# Patient Record
Sex: Female | Born: 1995 | Race: White | Hispanic: No | Marital: Single | Smoking: Never smoker
Health system: Southern US, Community
[De-identification: ages and names within clinical notes are randomized; demographics above are authoritative.]

---

## 2015-10-11 ENCOUNTER — Encounter (HOSPITAL_COMMUNITY): Payer: Self-pay | Admitting: Emergency Medicine

## 2015-10-11 ENCOUNTER — Observation Stay (HOSPITAL_COMMUNITY)
Admission: EM | Admit: 2015-10-11 | Discharge: 2015-10-12 | Disposition: A | Discharge status: Other Acute Inpatient Hospital | Payer: BLUE CROSS/BLUE SHIELD | Attending: Physician Assistant | Admitting: Physician Assistant

## 2015-10-11 DIAGNOSIS — N921 Excessive and frequent menstruation with irregular cycle: Secondary | ICD-10-CM | POA: Diagnosis present

## 2015-10-11 DIAGNOSIS — D649 Anemia, unspecified: Secondary | ICD-10-CM | POA: Diagnosis present

## 2015-10-11 DIAGNOSIS — R55 Syncope and collapse: Secondary | ICD-10-CM

## 2015-10-11 DIAGNOSIS — N938 Other specified abnormal uterine and vaginal bleeding: Secondary | ICD-10-CM | POA: Diagnosis not present

## 2015-10-11 DIAGNOSIS — D62 Acute posthemorrhagic anemia: Secondary | ICD-10-CM | POA: Diagnosis present

## 2015-10-11 LAB — CBC WITH DIFFERENTIAL/PLATELET
BASOS ABS: 0 10*3/uL (ref 0.0–0.1)
BASOS PCT: 0 %
Basophils Absolute: 0 10*3/uL (ref 0.0–0.1)
Basophils Relative: 0 %
EOS ABS: 0 10*3/uL (ref 0.0–0.7)
EOS PCT: 1 %
EOS PCT: 0 %
Eosinophils Absolute: 0.1 10*3/uL (ref 0.0–0.7)
HEMATOCRIT: 24.1 % — AB (ref 36.0–46.0)
HEMATOCRIT: 20.5 % — AB (ref 36.0–46.0)
HEMOGLOBIN: 6.7 g/dL — AB (ref 12.0–15.0)
Hemoglobin: 8 g/dL — ABNORMAL LOW (ref 12.0–15.0)
LYMPHS ABS: 1.7 10*3/uL (ref 0.7–4.0)
LYMPHS PCT: 17 %
LYMPHS PCT: 26 %
Lymphs Abs: 2.8 10*3/uL (ref 0.7–4.0)
MCH: 29.2 pg (ref 26.0–34.0)
MCH: 29 pg (ref 26.0–34.0)
MCHC: 33.2 g/dL (ref 30.0–36.0)
MCHC: 32.7 g/dL (ref 30.0–36.0)
MCV: 88 fL (ref 78.0–100.0)
MCV: 88.7 fL (ref 78.0–100.0)
MONO ABS: 0.6 10*3/uL (ref 0.1–1.0)
MONOS PCT: 3 %
Monocytes Absolute: 0.3 10*3/uL (ref 0.1–1.0)
Monocytes Relative: 6 %
NEUTROS ABS: 7.4 10*3/uL (ref 1.7–7.7)
NEUTROS PCT: 71 %
Neutro Abs: 7.7 10*3/uL (ref 1.7–7.7)
Neutrophils Relative %: 76 %
PLATELETS: 300 10*3/uL (ref 150–400)
Platelets: 296 10*3/uL (ref 150–400)
RBC: 2.74 MIL/uL — AB (ref 3.87–5.11)
RBC: 2.31 MIL/uL — ABNORMAL LOW (ref 3.87–5.11)
RDW: 12.1 % (ref 11.5–15.5)
RDW: 12.2 % (ref 11.5–15.5)
WBC: 9.8 10*3/uL (ref 4.0–10.5)
WBC: 10.8 10*3/uL — ABNORMAL HIGH (ref 4.0–10.5)

## 2015-10-11 LAB — COMPREHENSIVE METABOLIC PANEL
ALT: 12 U/L — AB (ref 14–54)
AST: 18 U/L (ref 15–41)
Albumin: 3.4 g/dL — ABNORMAL LOW (ref 3.5–5.0)
Alkaline Phosphatase: 45 U/L (ref 38–126)
Anion gap: 8 (ref 5–15)
BILIRUBIN TOTAL: 0.4 mg/dL (ref 0.3–1.2)
BUN: 11 mg/dL (ref 6–20)
CALCIUM: 8 mg/dL — AB (ref 8.9–10.3)
CO2: 25 mmol/L (ref 22–32)
CREATININE: 0.86 mg/dL (ref 0.44–1.00)
Chloride: 105 mmol/L (ref 101–111)
Glucose, Bld: 93 mg/dL (ref 65–99)
Potassium: 4 mmol/L (ref 3.5–5.1)
Sodium: 138 mmol/L (ref 135–145)
TOTAL PROTEIN: 5.9 g/dL — AB (ref 6.5–8.1)

## 2015-10-11 LAB — URINE MICROSCOPIC-ADD ON

## 2015-10-11 LAB — URINALYSIS, ROUTINE W REFLEX MICROSCOPIC
BILIRUBIN URINE: NEGATIVE
Glucose, UA: NEGATIVE mg/dL
KETONES UR: NEGATIVE mg/dL
Leukocytes, UA: NEGATIVE
NITRITE: NEGATIVE
PROTEIN: NEGATIVE mg/dL
Specific Gravity, Urine: 1.016 (ref 1.005–1.030)
pH: 7 (ref 5.0–8.0)

## 2015-10-11 LAB — ABO/RH: ABO/RH(D): O POS

## 2015-10-11 LAB — PREPARE RBC (CROSSMATCH)

## 2015-10-11 LAB — POC URINE PREG, ED: PREG TEST UR: NEGATIVE

## 2015-10-11 LAB — CBG MONITORING, ED: GLUCOSE-CAPILLARY: 85 mg/dL (ref 65–99)

## 2015-10-11 MED ORDER — FUROSEMIDE 10 MG/ML IJ SOLN
20.0000 mg | Freq: Once | INTRAMUSCULAR | Status: AC
  Administered 2015-10-11: 20 mg via INTRAVENOUS
  Filled 2015-10-11: qty 2

## 2015-10-11 MED ORDER — MEGESTROL ACETATE 40 MG PO TABS
120.0000 mg | ORAL_TABLET | Freq: Every day | ORAL | Status: DC
  Administered 2015-10-11 – 2015-10-12 (×2): 120 mg via ORAL
  Filled 2015-10-11 (×3): qty 3

## 2015-10-11 MED ORDER — STERILE WATER FOR INJECTION IJ SOLN
INTRAMUSCULAR | Status: AC
Start: 2015-10-11 — End: 2015-10-11
  Administered 2015-10-11: 5 mL
  Filled 2015-10-11: qty 10

## 2015-10-11 MED ORDER — ONDANSETRON HCL 4 MG/2ML IJ SOLN
4.0000 mg | Freq: Once | INTRAMUSCULAR | Status: AC
  Administered 2015-10-11: 4 mg via INTRAVENOUS
  Filled 2015-10-11: qty 2

## 2015-10-11 MED ORDER — SODIUM CHLORIDE 0.9 % IV SOLN
8.0000 mg | Freq: Three times a day (TID) | INTRAVENOUS | Status: DC | PRN
  Administered 2015-10-11 – 2015-10-12 (×2): 8 mg via INTRAVENOUS
  Filled 2015-10-11 (×4): qty 4

## 2015-10-11 MED ORDER — ONDANSETRON 4 MG PO TBDP: 4.0000 mg | ORAL_TABLET | Freq: Three times a day (TID) | ORAL | Status: DC | PRN

## 2015-10-11 MED ORDER — MEDROXYPROGESTERONE ACETATE 10 MG PO TABS
20.0000 mg | ORAL_TABLET | Freq: Every day | ORAL | Status: DC
  Administered 2015-10-11: 20 mg via ORAL
  Filled 2015-10-11 (×2): qty 2

## 2015-10-11 MED ORDER — SODIUM CHLORIDE 0.9 % IV SOLN: 10.0000 mL/h | Freq: Once | INTRAVENOUS | Status: DC

## 2015-10-11 MED ORDER — ESTROGENS CONJUGATED 25 MG IJ SOLR
25.0000 mg | Freq: Four times a day (QID) | INTRAMUSCULAR | Status: DC
Start: 2015-10-11 — End: 2015-10-12
  Administered 2015-10-11 – 2015-10-12 (×4): 25 mg via INTRAVENOUS
  Filled 2015-10-11 (×8): qty 25

## 2015-10-11 MED ORDER — SODIUM CHLORIDE 0.9 % IV BOLUS (SEPSIS)
1000.0000 mL | Freq: Once | INTRAVENOUS | Status: AC
Start: 2015-10-11 — End: 2015-10-11
  Administered 2015-10-11: 1000 mL via INTRAVENOUS

## 2015-10-11 MED ORDER — ESTROGENS CONJUGATED 25 MG IJ SOLR
25.0000 mg | Freq: Once | INTRAMUSCULAR | Status: AC
  Administered 2015-10-11: 25 mg via INTRAVENOUS
  Filled 2015-10-11: qty 25

## 2015-10-11 MED ORDER — ACETAMINOPHEN 325 MG PO TABS
650.0000 mg | ORAL_TABLET | Freq: Once | ORAL | Status: AC
  Administered 2015-10-11: 650 mg via ORAL
  Filled 2015-10-11: qty 2

## 2015-10-11 MED ORDER — MEDROXYPROGESTERONE ACETATE 10 MG PO TABS: 20.0000 mg | ORAL_TABLET | Freq: Every day | ORAL | Status: DC

## 2015-10-11 MED ORDER — DIPHENHYDRAMINE HCL 25 MG PO CAPS
25.0000 mg | ORAL_CAPSULE | Freq: Once | ORAL | Status: AC
  Administered 2015-10-11: 25 mg via ORAL
  Filled 2015-10-11: qty 1

## 2015-10-11 MED ORDER — SODIUM CHLORIDE 0.9 % IV SOLN
Freq: Once | INTRAVENOUS | Status: AC
  Administered 2015-10-11: 22:00:00 via INTRAVENOUS

## 2015-10-11 MED ORDER — IBUPROFEN 800 MG PO TABS: 800.0000 mg | ORAL_TABLET | Freq: Three times a day (TID) | ORAL | Status: DC | PRN

## 2015-10-11 MED ORDER — SODIUM CHLORIDE 0.9 % IV SOLN
Freq: Once | INTRAVENOUS | Status: AC
  Administered 2015-10-11: 23:00:00 via INTRAVENOUS

## 2015-10-11 MED ORDER — FUROSEMIDE 10 MG/ML IJ SOLN
20.0000 mg | Freq: Once | INTRAMUSCULAR | Status: AC
  Administered 2015-10-12: 20 mg via INTRAVENOUS
  Filled 2015-10-11: qty 2

## 2015-10-11 MED ORDER — SODIUM CHLORIDE 0.9 % IV BOLUS (SEPSIS)
2000.0000 mL | Freq: Once | INTRAVENOUS | Status: AC
  Administered 2015-10-11: 2000 mL via INTRAVENOUS

## 2015-10-11 MED ORDER — KETOROLAC TROMETHAMINE 30 MG/ML IJ SOLN
30.0000 mg | Freq: Once | INTRAMUSCULAR | Status: AC
  Administered 2015-10-11: 30 mg via INTRAVENOUS
  Filled 2015-10-11: qty 1

## 2015-10-11 NOTE — ED Notes (Signed)
Pt given applesauce and ginger ale 

## 2015-10-11 NOTE — ED Provider Notes (Signed)
CSN: [id number]     Arrival date & time 10/11/15  1134 History   First MD Initiated Contact with Patient 10/11/15 1204     Chief Complaint  Patient presents with  . Loss of Consciousness  . Vaginal Bleeding     (Consider location/radiation/quality/duration/timing/severity/associated sxs/prior Treatment) HPI   Crystal Rocha is a 19 y.o. female, patient with no pertinent past medical history, presenting to the ED with an episode of syncope this afternoon. Pt was lowered to the ground by her boyfriend. Boyfriend states pt was coming out of the bathroom when she told him she was going to pass out. Pt regained consciousness once she was on the ground. Patient has had vaginal bleeding for the last two weeks. The first week was normal for her period with normal timing and normal flow. The second week pt states she has been going through a pad and a tampon every half hour. Pt denies fever/chills, N/V, abdominal pain, urinary complaints, head trauma, seizures, or any other complaints. Pt does not have a regular PCP or OBGYN. Pt is not on OCP or other hormonal contraceptives. Pt states she knows she has been anemic before with a Hb of 8 or 9 (found during an attempted blood donation).     History reviewed. No pertinent past medical history. History reviewed. No pertinent past surgical history. History reviewed. No pertinent family history. Social History  Substance Use Topics  . Smoking status: Never Smoker   . Smokeless tobacco: None  . Alcohol Use: Yes     Comment: Rarely   OB History    Gravida Para Term Preterm AB TAB SAB Ectopic Multiple Living       Review of Systems  Respiratory: Negative for shortness of breath.   Cardiovascular: Negative for chest pain.  Gastrointestinal: Negative for vomiting, abdominal pain and diarrhea.  Genitourinary: Positive for vaginal bleeding. Negative for dysuria.  Skin: Negative for color change and pallor.  Neurological: Positive  for syncope. Negative for seizures, weakness and numbness.  All other systems reviewed and are negative.     Allergies  Review of patient's allergies indicates no known allergies.  Home Medications   Prior to Admission medications   Medication Sig Start Date End Date Taking? Authorizing Provider  dextromethorphan-guaiFENesin (MUCINEX DM) 30-600 MG 12hr tablet Take 1 tablet by mouth 2 (two) times daily as needed for cough (headcold).   Yes Historical Provider, MD  DM-Phenylephrine-Acetaminophen (TYLENOL COLD MAX PO) Take 1 tablet by mouth every 4 (four) hours as needed (cold symptoms).   Yes Historical Provider, MD  sodium-potassium bicarbonate (ALKA-SELTZER GOLD) TBEF dissolvable tablet Take 1 tablet by mouth once.   Yes Historical Provider, MD  medroxyPROGESTERone (PROVERA) 10 MG tablet Take 2 tablets (20 mg total) by mouth daily. 10/11/15   Amare Bail C Aaron Boeh, PA-C  ondansetron (ZOFRAN ODT) 4 MG disintegrating tablet Take 1 tablet (4 mg total) by mouth every 8 (eight) hours as needed for nausea or vomiting. 10/11/15   Burgundy Matuszak C Desiree Daise, PA-C   BP 112/69 mmHg  Pulse 116  Resp 18  SpO2 97%  LMP 09/27/2015 (Exact Date) Physical Exam  Constitutional: She appears well-developed and well-nourished. No distress.  HENT:  Head: Normocephalic and atraumatic.  Eyes: Conjunctivae are normal. Pupils are equal, round, and reactive to light.  Cardiovascular: Normal rate, regular rhythm and normal heart sounds.   Pulmonary/Chest: Effort normal and breath sounds normal. No respiratory distress.  Abdominal: Soft. Normal  appearance and bowel sounds are normal. There is no tenderness. There is no CVA tenderness.  Genitourinary: Pelvic exam was performed with patient supine.  External genitalia normal Vagina with copious bleeding and a large clot about the size of tennis ball Cervix  Os closed negative for cervical motion tenderness Adnexa palpated, no masses or negative for tenderness noted Bladder palpated  negative for tenderness Uterus palpated no masses or negative for tenderness Otherwise normal female genitalia. RN Fleet Contras served as chaperone during exam. Dr. Clarene Duke was called in and also evaluated the patient during the pelvic exam.   Musculoskeletal: She exhibits no edema or tenderness.  Neurological: She is alert.  Skin: Skin is warm and dry. She is not diaphoretic.  Nursing note and vitals reviewed.   ED Course  Procedures (including critical care time) Labs Review Labs Reviewed  CBC WITH DIFFERENTIAL/PLATELET - Abnormal; Notable for the following:    RBC 2.74 (*)    Hemoglobin 8.0 (*)    HCT 24.1 (*)    All other components within normal limits  COMPREHENSIVE METABOLIC PANEL - Abnormal; Notable for the following:    Calcium 8.0 (*)    Total Protein 5.9 (*)    Albumin 3.4 (*)    ALT 12 (*)    All other components within normal limits  URINALYSIS, ROUTINE W REFLEX MICROSCOPIC (NOT AT [facility]) - Abnormal; Notable for the following:    APPearance CLOUDY (*)    Hgb urine dipstick LARGE (*)    All other components within normal limits  URINE MICROSCOPIC-ADD ON - Abnormal; Notable for the following:    Squamous Epithelial / LPF 0-5 (*)    Bacteria, UA FEW (*)    All other components within normal limits  CBC WITH DIFFERENTIAL/PLATELET - Abnormal; Notable for the following:    WBC 10.8 (*)    RBC 2.31 (*)    Hemoglobin 6.7 (*)    HCT 20.5 (*)    All other components within normal limits  CBG MONITORING, ED  POC URINE PREG, ED  PREPARE RBC (CROSSMATCH)  TYPE AND SCREEN    Imaging Review No results found. I have personally reviewed and evaluated these images and lab results as part of my medical decision-making.   EKG Interpretation None      CRITICAL CARE Performed by: Rutherford Alarie C Brooks Stotz Total critical care time: 60 minutes Critical care time was exclusive of separately billable procedures and treating other patients. Critical care was necessary to treat or prevent  imminent or life-threatening deterioration. Critical care was time spent personally by me on the following activities: development of treatment plan with patient and/or surrogate as well as nursing, discussions with consultants, evaluation of patient's response to treatment, examination of patient, obtaining history from patient or surrogate, ordering and performing treatments and interventions, ordering and review of laboratory studies, ordering and review of radiographic studies, pulse oximetry and re-evaluation of patient's condition.   MDM   Final diagnoses:  Dysfunctional uterine bleeding  Syncope, unspecified syncope type  Anemia, unspecified anemia type    Kimanh Ashbaugh presents with heavy vaginal bleeding for the last week and a syncopal episode today.  Findings and plan of care discussed with Laurence Spates, MD.  Patient is significantly anemic, her exam reveals significant current uterine bleeding, but her abdominal exam is benign and she is negative for CMT. OB/GYN consult called. 1:48 PM Spoke with Dr. Debroah Loop, OBGYN on call, institute hormonal therapy and iron. Check Hb in 4 hours if patient not  feeling better. 25mg  Premarin IV. Bleeding should be slowed in an hour or two. Provera 20mg /day for 30 days. If the hemoglobin continues to drop, [facility]. 3:48 PM patient states that she is still bleeding at the same rate. Patient also complains of some continued dizziness. Patient continues to deny pain or discomfort. Patient is tolerating food and oral fluid intake. 4:40 PM Repeat Hb found to be 6.1 (value called to RN and then communicated with me). [facility]. Agreed to accept the patient for direct admit to the third floor. Requested that we start the blood transfusion here in the ED. patient was updated on this new plan of care and the reasons were explained to her. The risks and benefits of blood transfusion were  communicated with the patient. Patient voiced understanding of this information and agreed to the blood transfusion. [facility] also [facility]. 5:27 PM End of shift patient care handoff report given to [facility]cl[facility] FlintJamie Ward, PA-C and asked her to check in on the patient every about 20-30 minutes.   Filed Vitals:   10/11/15 1156 10/11/15 1433 10/11/15 1644  BP: 111/82 130/73 112/69  Pulse: 92 98 116  Resp: 22 16 18   SpO2: 98% 98% 97%     Anselm PancoastShawn C Zacharia Sowles, PA-C 10/11/15 1729  Laurence Spatesachel Morgan Little, MD 10/12/15 [phone]

## 2015-10-11 NOTE — ED Notes (Signed)
RN assisted Shawn, PA with pelvic exam.  Pt has heavy bleeding, unable to get samples.

## 2015-10-11 NOTE — ED Notes (Signed)
Bed: WA21 Expected date:  Expected time:  Means of arrival:  Comments: EMS- 20s F, abnormal vaginal bleeding

## 2015-10-11 NOTE — ED Provider Notes (Signed)
Care assumed from previous provider PA Joy, case discussed, plan agreed upon.  [facility] is awaiting [facility]. Will receive first 15 minutes of transfusion here before transfer in order to delay treatment. Patient seen by me and at this time, patient is in good and stable condition.   Plan: continue to monitor for transfusion reaction until transfer to [facility].   6:57 PM - Patient transferred via carelink.   Chase PicketJaime Pilcher Joseandres Mazer, PA-C 10/11/15 1857  Courteney Randall AnLyn Mackuen, MD 10/12/15 [phone]

## 2015-10-11 NOTE — ED Notes (Signed)
Pt states that she had menstrual perios 2 weeks ago that was normal.  Yesterday she began to have cramping and spotting.   She has gone through 12 pads in a 24 hour period with no pain.  Pt had BM today, stood up and had syncopal episode.  No trauma from fall.  Pt reports continued significant bleeding with dizziness today.  BP 111/68 p 84 CBG 111  98% RA

## 2015-10-11 NOTE — Progress Notes (Signed)
Patient noted to have [employer] insurance without a pcp.  EDCM spoke to patient at bedside.  [place].

## 2015-10-12 ENCOUNTER — Observation Stay (HOSPITAL_COMMUNITY): Payer: BLUE CROSS/BLUE SHIELD

## 2015-10-12 DIAGNOSIS — D649 Anemia, unspecified: Secondary | ICD-10-CM | POA: Diagnosis present

## 2015-10-12 DIAGNOSIS — R55 Syncope and collapse: Secondary | ICD-10-CM | POA: Diagnosis not present

## 2015-10-12 DIAGNOSIS — N938 Other specified abnormal uterine and vaginal bleeding: Secondary | ICD-10-CM

## 2015-10-12 DIAGNOSIS — D62 Acute posthemorrhagic anemia: Secondary | ICD-10-CM

## 2015-10-12 LAB — CBC
HCT: 29.9 % — ABNORMAL LOW (ref 36.0–46.0)
HEMOGLOBIN: 10.5 g/dL — AB (ref 12.0–15.0)
MCH: 30.9 pg (ref 26.0–34.0)
MCHC: 35.1 g/dL (ref 30.0–36.0)
MCV: 87.9 fL (ref 78.0–100.0)
PLATELETS: 222 10*3/uL (ref 150–400)
RBC: 3.4 MIL/uL — ABNORMAL LOW (ref 3.87–5.11)
RDW: 12.8 % (ref 11.5–15.5)
WBC: 12.9 10*3/uL — ABNORMAL HIGH (ref 4.0–10.5)

## 2015-10-12 MED ORDER — IBUPROFEN 800 MG PO TABS: 800.0000 mg | ORAL_TABLET | Freq: Three times a day (TID) | ORAL | Status: AC | PRN

## 2015-10-12 MED ORDER — MEGESTROL ACETATE 40 MG PO TABS: 80.0000 mg | ORAL_TABLET | Freq: Every day | ORAL | Status: AC

## 2015-10-12 MED ORDER — FERROUS SULFATE 325 (65 FE) MG PO TABS: 325.0000 mg | ORAL_TABLET | Freq: Two times a day (BID) | ORAL | Status: AC

## 2015-10-12 MED ORDER — IBUPROFEN 800 MG PO TABS: 800.0000 mg | ORAL_TABLET | Freq: Three times a day (TID) | ORAL | Status: DC | PRN

## 2015-10-12 MED ORDER — MEGESTROL ACETATE 40 MG PO TABS: 80.0000 mg | ORAL_TABLET | Freq: Every day | ORAL | Status: DC

## 2015-10-12 MED ORDER — FERROUS SULFATE 325 (65 FE) MG PO TABS: 325.0000 mg | ORAL_TABLET | Freq: Two times a day (BID) | ORAL | Status: DC

## 2015-10-12 NOTE — Discharge Summary (Signed)
Physician Discharge Summary  Patient ID: Crystal Rocha MRN: [id number] DOB/AGE: 1996/06/22 19 y.o.  Admit date: 10/11/2015 Discharge date: 10/12/2015  Admission Diagnoses:DUB, anemia due to acute blood loss  Discharge Diagnoses: same Active Problems:   Menometrorrhagia   Acute blood loss anemia   Anemia   DUB (dysfunctional uterine bleeding)   Discharged Condition: good  Hospital Course:   Expand All Collapse All   HPI   Crystal Rocha is a 19 y.o. female, patient with no pertinent past medical history, presenting to the ED with an episode of syncope this afternoon. Pt was lowered to the ground by her boyfriend. Boyfriend states pt was coming out of the bathroom when she told him she was going to pass out. Pt regained consciousness once she was on the ground. Patient has had vaginal bleeding for the last two weeks. The first week was normal for her period with normal timing and normal flow. The second week pt states she has been going through a pad and a tampon every half hour. Pt denies fever/chills, N/V, abdominal pain, urinary complaints, head trauma, seizures, or any other complaints. Pt does not have a regular PCP or OBGYN. Pt is not on OCP or other hormonal contraceptives. Pt states she knows she has been anemic before with a Hb of 8 or 9 (found during an attempted blood donation).     History reviewed. No pertinent past medical history. History reviewed. No pertinent past surgical history. History reviewed. No pertinent family history. Social History  Substance Use Topics  . Smoking status: Never Smoker   . Smokeless tobacco: None  . Alcohol Use: Yes     Comment: Rarely   OB History    Gravida Para Term Preterm AB TAB SAB Ectopic Multiple Living       Review of Systems  Respiratory: Negative for shortness of breath.  Cardiovascular: Negative for chest pain.   Gastrointestinal: Negative for vomiting, abdominal pain and diarrhea.  Genitourinary: Positive for vaginal bleeding. Negative for dysuria.  Skin: Negative for color change and pallor.  Neurological: Positive for syncope. Negative for seizures, weakness and numbness.  All other systems reviewed and are negative.     Allergies  Review of patient's allergies indicates no known allergies.  Home Medications   Prior to Admission medications   Medication Sig Start Date End Date Taking? Authorizing Provider  dextromethorphan-guaiFENesin (MUCINEX DM) 30-600 MG 12hr tablet Take 1 tablet by mouth 2 (two) times daily as needed for cough (headcold).   Yes Historical Provider, MD  DM-Phenylephrine-Acetaminophen (TYLENOL COLD MAX PO) Take 1 tablet by mouth every 4 (four) hours as needed (cold symptoms).   Yes Historical Provider, MD  sodium-potassium bicarbonate (ALKA-SELTZER GOLD) TBEF dissolvable tablet Take 1 tablet by mouth once.   Yes Historical Provider, MD  medroxyPROGESTERone (PROVERA) 10 MG tablet Take 2 tablets (20 mg total) by mouth daily. 10/11/15   Shawn C Joy, PA-C  ondansetron (ZOFRAN ODT) 4 MG disintegrating tablet Take 1 tablet (4 mg total) by mouth every 8 (eight) hours as needed for nausea or vomiting. 10/11/15   Shawn C Joy, PA-C   BP 112/69 mmHg  Pulse 116  Resp 18  SpO2 97%  LMP 09/27/2015 (Exact Date) Physical Exam  Constitutional: She appears well-developed and well-nourished. No distress.  HENT:  Head: Normocephalic and atraumatic.  Eyes: Conjunctivae are normal. Pupils are equal, round, and reactive to light.  Cardiovascular: Normal rate, regular rhythm and normal  heart sounds.  Pulmonary/Chest: Effort normal and breath sounds normal. No respiratory distress.  Abdominal: Soft. Normal appearance and bowel sounds are normal. There is no tenderness. There is no CVA tenderness.   Genitourinary: Pelvic exam was performed with patient supine.  External genitalia normal Vagina with copious bleeding and a large clot about the size of tennis ball Cervix Os closed negative for cervical motion tenderness Adnexa palpated, no masses or negative for tenderness noted Bladder palpated negative for tenderness Uterus palpated no masses or negative for tenderness Otherwise normal female genitalia. RN Fleet Contras served as chaperone during exam. Dr. Clarene Duke was called in and also evaluated the patient during the pelvic exam.  Musculoskeletal: She exhibits no edema or tenderness.  Neurological: She is alert.  Skin: Skin is warm and dry. She is not diaphoretic.  Nursing note and vitals reviewed.   ED Course  Procedures (including critical care time) Labs Review Labs Reviewed  CBC WITH DIFFERENTIAL/PLATELET - Abnormal; Notable for the following:    RBC 2.74 (*)    Hemoglobin 8.0 (*)    HCT 24.1 (*)    All other components within normal limits  COMPREHENSIVE METABOLIC PANEL - Abnormal; Notable for the following:    Calcium 8.0 (*)    Total Protein 5.9 (*)    Albumin 3.4 (*)    ALT 12 (*)    All other components within normal limits  URINALYSIS, ROUTINE W REFLEX MICROSCOPIC (NOT AT [facility]) - Abnormal; Notable for the following:    APPearance CLOUDY (*)    Hgb urine dipstick LARGE (*)    All other components within normal limits  URINE MICROSCOPIC-ADD ON - Abnormal; Notable for the following:    Squamous Epithelial / LPF 0-5 (*)    Bacteria, UA FEW (*)    All other components within normal limits  CBC WITH DIFFERENTIAL/PLATELET - Abnormal; Notable for the following:    WBC 10.8 (*)    RBC 2.31 (*)    Hemoglobin 6.7 (*)    HCT 20.5 (*)    All other components within normal limits  CBG MONITORING, ED  POC URINE  PREG, ED  PREPARE RBC (CROSSMATCH)  TYPE AND SCREEN    Imaging Review  Imaging Results (Last 48 hours)    No results found.   I have personally reviewed and evaluated these images and lab results as part of my medical decision-making.  EKG Interpretation None    CRITICAL CARE Performed by: Shawn C Joy Total critical care time: 60 minutes Critical care time was exclusive of separately billable procedures and treating other patients. Critical care was necessary to treat or prevent imminent or life-threatening deterioration. Critical care was time spent personally by me on the following activities: development of treatment plan with patient and/or surrogate as well as nursing, discussions with consultants, evaluation of patient's response to treatment, examination of patient, obtaining history from patient or surrogate, ordering and performing treatments and interventions, ordering and review of laboratory studies, ordering and review of radiographic studies, pulse oximetry and re-evaluation of patient's condition.   MDM   Final diagnoses:  Dysfunctional uterine bleeding  Syncope, unspecified syncope type  Anemia, unspecified anemia type    Crystal Rocha presents with heavy vaginal bleeding for the last week and a syncopal episode today.  Findings and plan of care discussed with Laurence Spates, MD.  Patient is significantly anemic, her exam reveals significant current uterine bleeding, but her abdominal exam is benign and she is negative for CMT. OB/GYN consult  called. 1:48 PM Spoke with Dr. Debroah Loop, OBGYN on call, institute hormonal therapy and iron. Check Hb in 4 hours if patient not feeling better. 25mg  Premarin IV. Bleeding should be slowed in an hour or two. Provera 20mg /day for 30 days. If the hemoglobin continues to drop, transfer patient to [facility]. 3:48 PM patient states that she is still bleeding at the same rate. Patient also  complains of some continued dizziness. Patient continues to deny pain or discomfort. Patient is tolerating food and oral fluid intake. 4:40 PM Repeat Hb found to be 6.1 (value called to RN and then communicated with me). Spoke to Dr. Debroah Loop at [facility]. Agreed to accept the patient for direct admit to the third floor. Requested that we start the blood transfusion here in the ED. patient was updated on this new plan of care and the reasons were explained to her. The risks and benefits of blood transfusion were communicated with the patient. Patient voiced understanding of this information and agreed to the blood transfusion. Patient also agrees to the transfer to [facility]. 5:27 PM End of shift patient care handoff report given to [facility], PA-C and asked her to check in on the patient every about 20-30 minutes.   Filed Vitals:   10/11/15 1156 10/11/15 1433 10/11/15 1644  BP: 111/82 130/73 112/69  Pulse: 92 98 116  Resp: 22 16 18   SpO2: 98% 98% 97%     Anselm Pancoast, PA-C 10/11/15 1729  Laurence Spates, MD 10/12/15 [phone]       DUB Anemia requiring transfusion  Transfuse 3 units IV premarin Oral megestrol  Crystal Arms, MD      Patient received transfusion and hemoglobin was >10. She felt much better and bleeding improved but was still present after several doses of Premarin and Megace.  Consults: None  Significant Diagnostic Studies:CLINICAL DATA: Patient with heavy vaginal bleeding. Anemia.  EXAM: TRANSABDOMINAL AND TRANSVAGINAL ULTRASOUND OF PELVIS  TECHNIQUE: Both transabdominal and transvaginal ultrasound examinations of the pelvis were performed. Transabdominal technique was performed for global imaging of the pelvis including uterus, ovaries, adnexal regions, and pelvic cul-de-sac. It was necessary to proceed with endovaginal exam following the transabdominal exam to visualize  the endometrium.  COMPARISON: None  FINDINGS: Uterus  Measurements: 8.7 x 4.4 x 5.9 cm. No fibroids or other mass visualized.  Endometrium  The endometrial cavity particularly within the mid body and fundus is distended with complicated fluid. Within the lower uterine segment there is thickening of the endometrium with hyperechoic material measuring up to 1.5 cm.  Right ovary  Measurements: 2.6 x 1.3 x 1.5 cm. Normal appearance/no adnexal mass.  Left ovary  Measurements: 3.1 x 2.4 x 2.4 cm. Normal appearance/no adnexal mass.  Other findings  Small amount of mildly complicated fluid in the pelvis.  IMPRESSION: The endometrial cavity is distended with complicated fluid most compatible with blood products. Additionally within the endometrium at the level the lower uterine segment there is distention of the endometrium with predominantly echogenic material which may represent an endometrial mass, endometrial thickening or clotted blood products. This area measures up to 1.5 x 1.2 cm.  Consider further evaluation with sonohysterogram for confirmation prior to hysteroscopy. Endometrial sampling should also be considered if patient is at high risk for endometrial carcinoma. (Ref: Radiological Reasoning: Algorithmic Workup of Abnormal Vaginal Bleeding with Endovaginal Sonography and Sonohysterography. AJR 2008; 952:W41-32)   Electronically Signed  By: Annia Belt M.D.  On: 10/12/2015 10:58  Treatments: IV hydration  and transfusion 3 units PRBC  Discharge Exam: Blood pressure 128/61, pulse 94, temperature 98.6 F (37 C), temperature source Oral, resp. rate 17, height 5' (1.524 m), weight 125 lb (56.7 kg), last menstrual period 09/27/2015, SpO2 98 %. Alert cooperative not pale no distress  Disposition: Discharge home     Medication List    TAKE these medications        dextromethorphan-guaiFENesin 30-600 MG 12hr tablet  Commonly known as:   MUCINEX DM  Take 1 tablet by mouth 2 (two) times daily as needed for cough (headcold).     ferrous sulfate 325 (65 FE) MG tablet  Take 1 tablet (325 mg total) by mouth 2 (two) times daily with a meal.     ibuprofen 800 MG tablet  Commonly known as:  ADVIL,MOTRIN  Take 1 tablet (800 mg total) by mouth 3 (three) times daily as needed for cramping.     megestrol 40 MG tablet  Commonly known as:  MEGACE  Take 2 tablets (80 mg total) by mouth daily.     ondansetron 4 MG disintegrating tablet  Commonly known as:  ZOFRAN ODT  Take 1 tablet (4 mg total) by mouth every 8 (eight) hours as needed for nausea or vomiting.     sodium-potassium bicarbonate Tbef dissolvable tablet  Commonly known as:  ALKA-SELTZER GOLD  Take 1 tablet by mouth once.     TYLENOL COLD MAX PO  Take 1 tablet by mouth every 4 (four) hours as needed (cold symptoms).       F/u with her local Gyn in [place]  Signed: Tashiya Rocha  10/12/2015, 1:28 PM

## 2015-10-12 NOTE — Progress Notes (Addendum)
Anemia secondary to DUB requiring transfusion  Subjective: Patient reports continued bleeding, it is a little better, cramping continues.    Objective: I have reviewed patient's vital signs, intake and output, medications and labs.  General: alert, cooperative and no distress GI: soft, non-tender; bowel sounds normal; no masses,  no organomegaly     Assessment/Plan: DUB Anemia secondary to DUB requiring transfusion, has received 3 units  Continue Iv premarin and megestrol Sonogram today  CBC pending  LOS: 1 day    Crystal Rocha H 10/12/2015, 7:49 AM

## 2015-10-12 NOTE — Progress Notes (Signed)
Pt discharged to home with significant other.  Condition stable.  Pt ambulated to car with SO.  No equipment for home ordered at discharge.

## 2015-10-12 NOTE — Discharge Instructions (Signed)

## 2015-10-12 NOTE — H&P (Signed)
HPI   Crystal Rocha is a 19 y.o. female, patient with no pertinent past medical history, presenting to the ED with an episode of syncope this afternoon. Pt was lowered to the ground by her boyfriend. Boyfriend states pt was coming out of the bathroom when she told him she was going to pass out. Pt regained consciousness once she was on the ground. Patient has had vaginal bleeding for the last two weeks. The first week was normal for her period with normal timing and normal flow. The second week pt states she has been going through a pad and a tampon every half hour. Pt denies fever/chills, N/V, abdominal pain, urinary complaints, head trauma, seizures, or any other complaints. Pt does not have a regular PCP or OBGYN. Pt is not on OCP or other hormonal contraceptives. Pt states she knows she has been anemic before with a Hb of 8 or 9 (found during an attempted blood donation).     History reviewed. No pertinent past medical history. History reviewed. No pertinent past surgical history. History reviewed. No pertinent family history. Social History  Substance Use Topics  . Smoking status: Never Smoker   . Smokeless tobacco: None  . Alcohol Use: Yes     Comment: Rarely   OB History    Gravida Para Term Preterm AB TAB SAB Ectopic Multiple Living       Review of Systems  Respiratory: Negative for shortness of breath.  Cardiovascular: Negative for chest pain.  Gastrointestinal: Negative for vomiting, abdominal pain and diarrhea.  Genitourinary: Positive for vaginal bleeding. Negative for dysuria.  Skin: Negative for color change and pallor.  Neurological: Positive for syncope. Negative for seizures, weakness and numbness.  All other systems reviewed and are negative.     Allergies  Review of patient's allergies indicates no known allergies.  Home Medications   Prior to Admission medications   Medication Sig Start  Date End Date Taking? Authorizing Provider  dextromethorphan-guaiFENesin (MUCINEX DM) 30-600 MG 12hr tablet Take 1 tablet by mouth 2 (two) times daily as needed for cough (headcold).   Yes Historical Provider, MD  DM-Phenylephrine-Acetaminophen (TYLENOL COLD MAX PO) Take 1 tablet by mouth every 4 (four) hours as needed (cold symptoms).   Yes Historical Provider, MD  sodium-potassium bicarbonate (ALKA-SELTZER GOLD) TBEF dissolvable tablet Take 1 tablet by mouth once.   Yes Historical Provider, MD  medroxyPROGESTERone (PROVERA) 10 MG tablet Take 2 tablets (20 mg total) by mouth daily. 10/11/15   Shawn C Joy, PA-C  ondansetron (ZOFRAN ODT) 4 MG disintegrating tablet Take 1 tablet (4 mg total) by mouth every 8 (eight) hours as needed for nausea or vomiting. 10/11/15   Shawn C Joy, PA-C   BP 112/69 mmHg  Pulse 116  Resp 18  SpO2 97%  LMP 09/27/2015 (Exact Date) Physical Exam  Constitutional: She appears well-developed and well-nourished. No distress.  HENT:  Head: Normocephalic and atraumatic.  Eyes: Conjunctivae are normal. Pupils are equal, round, and reactive to light.  Cardiovascular: Normal rate, regular rhythm and normal heart sounds.  Pulmonary/Chest: Effort normal and breath sounds normal. No respiratory distress.  Abdominal: Soft. Normal appearance and bowel sounds are normal. There is no tenderness. There is no CVA tenderness.  Genitourinary: Pelvic exam was performed with patient supine.  External genitalia normal Vagina with copious bleeding and a large clot about the size of tennis ball Cervix Os closed negative for cervical motion tenderness Adnexa palpated, no  masses or negative for tenderness noted Bladder palpated negative for tenderness Uterus palpated no masses or negative for tenderness Otherwise normal female genitalia. RN Fleet Contras served as chaperone during exam. Dr. Clarene Duke was called in and also evaluated the patient during the pelvic  exam.  Musculoskeletal: She exhibits no edema or tenderness.  Neurological: She is alert.  Skin: Skin is warm and dry. She is not diaphoretic.  Nursing note and vitals reviewed.   ED Course  Procedures (including critical care time) Labs Review Labs Reviewed  CBC WITH DIFFERENTIAL/PLATELET - Abnormal; Notable for the following:    RBC 2.74 (*)    Hemoglobin 8.0 (*)    HCT 24.1 (*)    All other components within normal limits  COMPREHENSIVE METABOLIC PANEL - Abnormal; Notable for the following:    Calcium 8.0 (*)    Total Protein 5.9 (*)    Albumin 3.4 (*)    ALT 12 (*)    All other components within normal limits  URINALYSIS, ROUTINE W REFLEX MICROSCOPIC (NOT AT [facility]) - Abnormal; Notable for the following:    APPearance CLOUDY (*)    Hgb urine dipstick LARGE (*)    All other components within normal limits  URINE MICROSCOPIC-ADD ON - Abnormal; Notable for the following:    Squamous Epithelial / LPF 0-5 (*)    Bacteria, UA FEW (*)    All other components within normal limits  CBC WITH DIFFERENTIAL/PLATELET - Abnormal; Notable for the following:    WBC 10.8 (*)    RBC 2.31 (*)    Hemoglobin 6.7 (*)    HCT 20.5 (*)    All other components within normal limits  CBG MONITORING, ED  POC URINE PREG, ED  PREPARE RBC (CROSSMATCH)  TYPE AND SCREEN    Imaging Review  Imaging Results (Last 48 hours)    No results found.   I have personally reviewed and evaluated these images and lab results as part of my medical decision-making.   EKG Interpretation None    CRITICAL CARE Performed by: Shawn C Joy Total critical care time: 60 minutes Critical care time was exclusive of separately billable procedures and treating other patients. Critical care was necessary to treat or prevent imminent or life-threatening deterioration. Critical care was time spent personally by me on the following  activities: development of treatment plan with patient and/or surrogate as well as nursing, discussions with consultants, evaluation of patient's response to treatment, examination of patient, obtaining history from patient or surrogate, ordering and performing treatments and interventions, ordering and review of laboratory studies, ordering and review of radiographic studies, pulse oximetry and re-evaluation of patient's condition.   MDM   Final diagnoses:  Dysfunctional uterine bleeding  Syncope, unspecified syncope type  Anemia, unspecified anemia type    Crystal Rocha presents with heavy vaginal bleeding for the last week and a syncopal episode today.  Findings and plan of care discussed with Laurence Spates, MD.  Patient is significantly anemic, her exam reveals significant current uterine bleeding, but her abdominal exam is benign and she is negative for CMT. OB/GYN consult called. 1:48 PM Spoke with Dr. Debroah Loop, OBGYN on call, institute hormonal therapy and iron. Check Hb in 4 hours if patient not feeling better.  Premarin IV. Bleeding should be slowed in an hour or two. Provera /day for 30 days. If the hemoglobin continues to drop, transfer patient to [facility]. 3:48 PM patient states that she is still bleeding at the same rate. Patient also complains  of some continued dizziness. Patient continues to deny pain or discomfort. Patient is tolerating food and oral fluid intake. 4:40 PM Repeat Hb found to be 6.1 (value called to RN and then communicated with me). [facility] Agreed to accept the patient for direct admit to the third floor. Requested that we start the blood transfusion here in the ED. patient was updated on this new plan of care and the reasons were explained to her. The risks and benefits of blood transfusion were communicated with the patient. Patient voiced understanding of this information and agreed to the blood  transfusion. [facility] 5:27 PM End of shift patient care handoff report given to [facility], PA-C and asked her to check in on the patient every about 20-30 minutes.   Filed Vitals:   10/11/15 1156 10/11/15 1433 10/11/15 1644  BP: 111/82 130/73 112/69  Pulse: 92 98 116  Resp: 22 16 18   SpO2: 98% 98% 97%     Anselm PancoastShawn C Joy, PA-C 10/11/15 1729  Laurence Spatesachel Morgan Little, MD 10/12/15 [phone]       DUB Anemia requiring transfusion  Transfuse 3 units IV premarin Oral megestrol  Crystal ArmsEURE,Skila Rollins H, MD

## 2015-10-13 LAB — TYPE AND SCREEN
ABO/RH(D): O POS
Antibody Screen: POSITIVE
DAT, IgG: NEGATIVE
UNIT DIVISION: 0
Unit division: 0

## 2015-10-15 LAB — TYPE AND SCREEN
ABO/RH(D): O POS
Antibody Screen: NEGATIVE
UNIT DIVISION: 0
UNIT DIVISION: 0

## 2016-12-13 IMAGING — US US TRANSVAGINAL NON-OB
1 series · 15 of 25 positions shown · non-contrast
Comparison: None

CLINICAL DATA: Patient with heavy vaginal bleeding.  Anemia.



[Series 1: us transvaginal non-ob · 77 acquisitions, 15 frames shown]
[im 1/77]
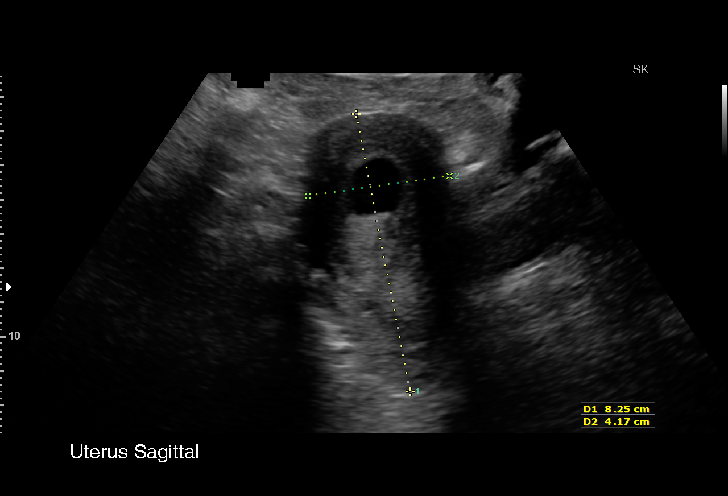
[im 7/77]
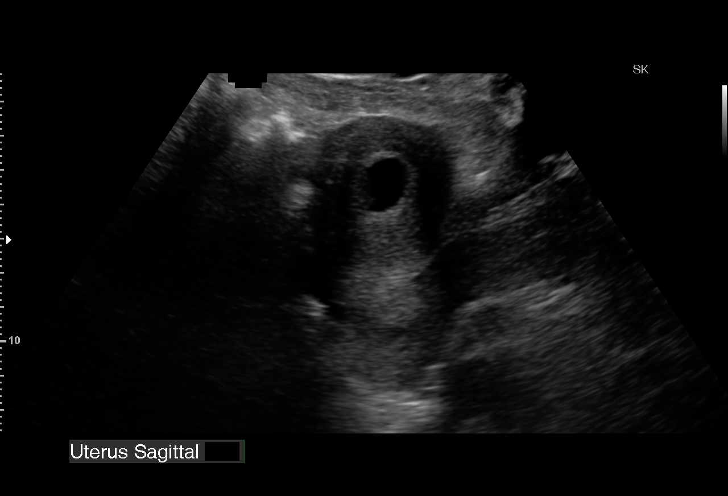
[im 13/77]
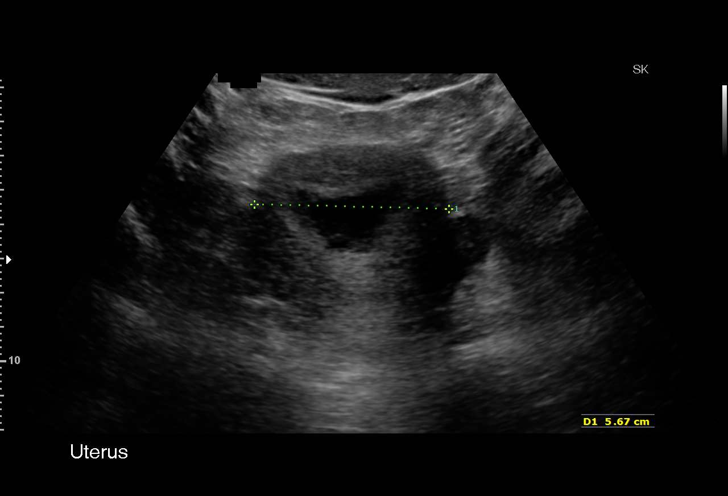
[im 16/77]
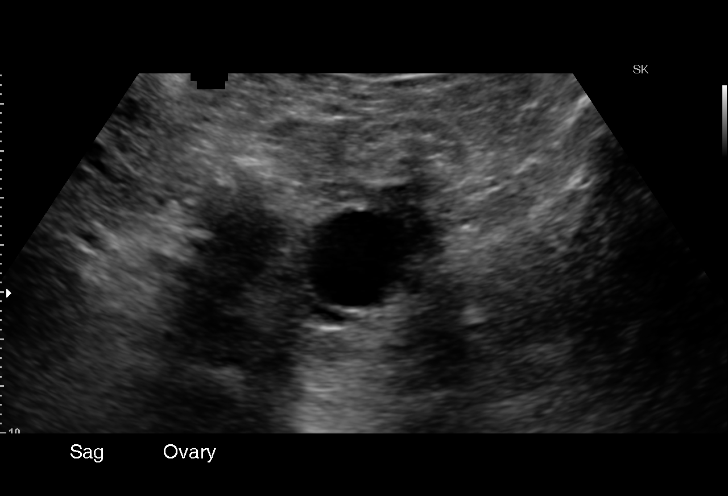
[im 23/77]
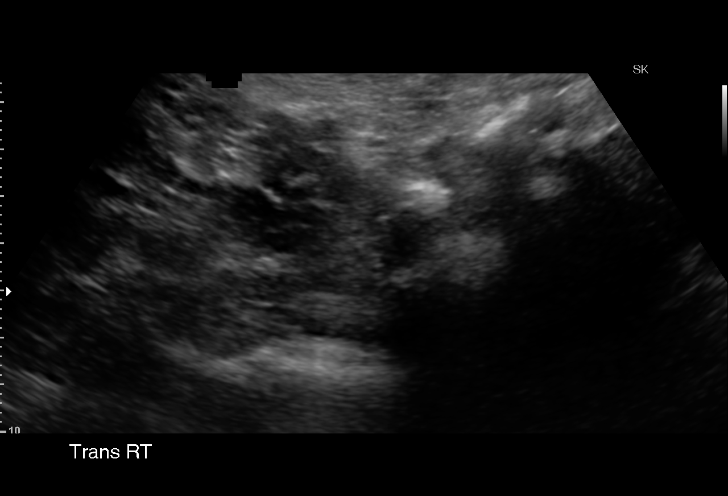
[im 29/77]
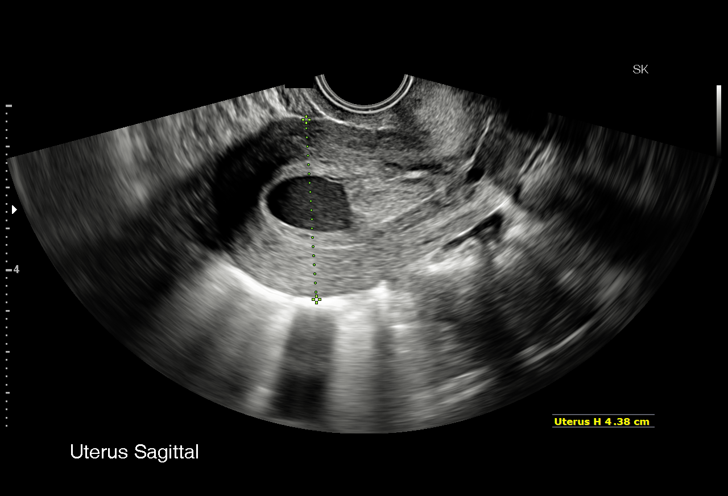
[im 32/77]
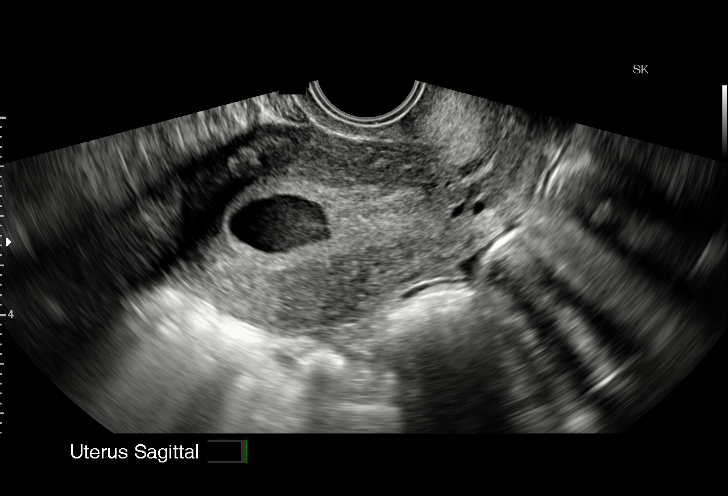
[im 39/77]
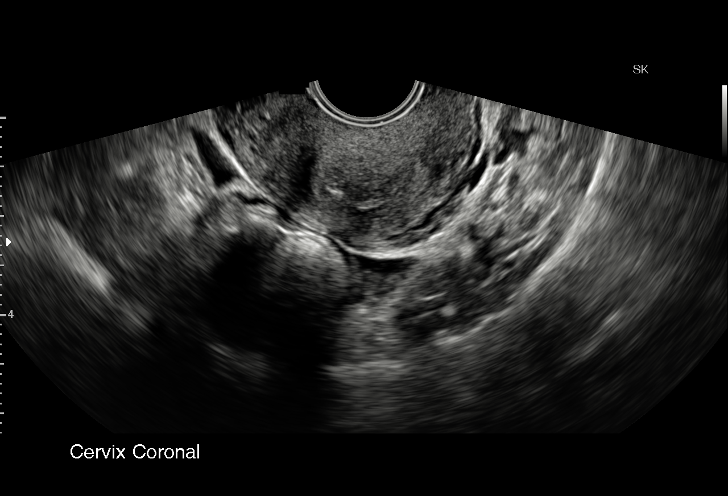
[im 45/77]
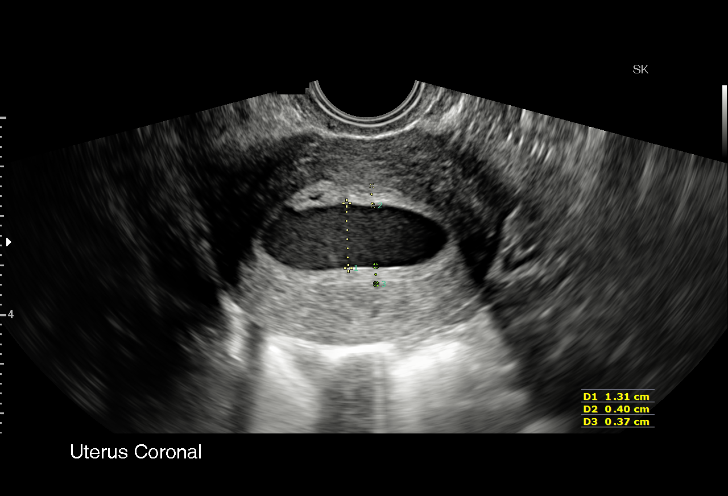
[im 48/77]
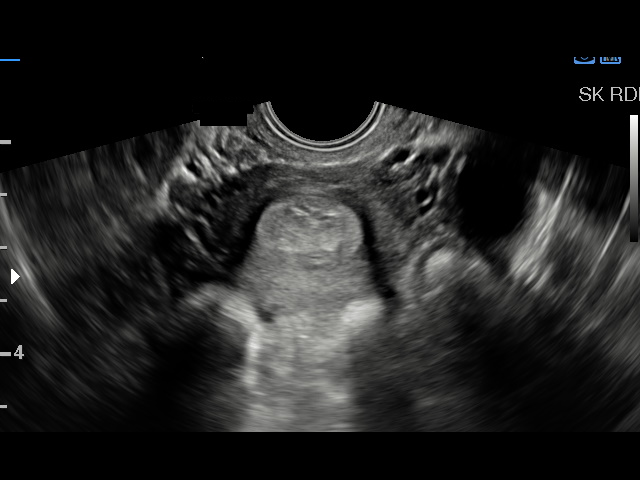
[im 54/77]
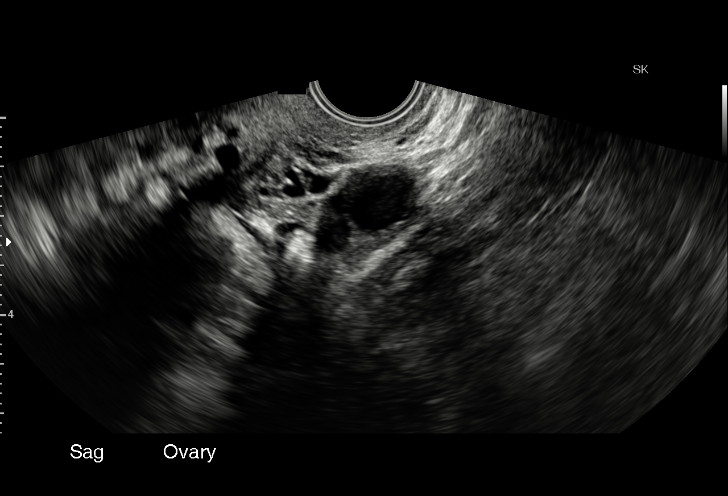
[im 61/77]
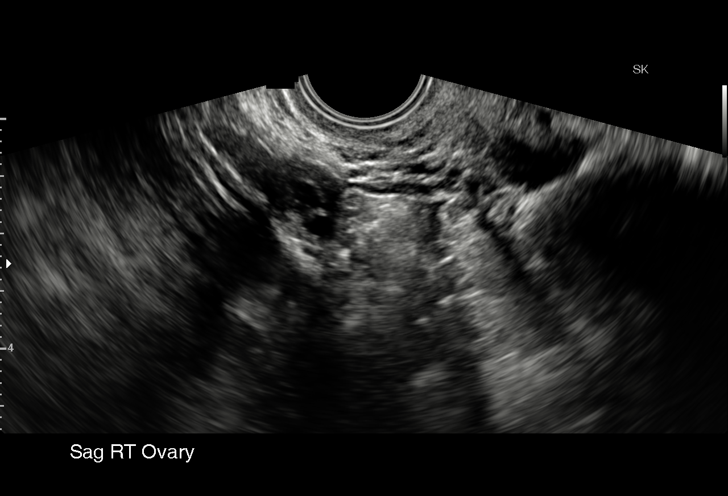
[im 64/77]
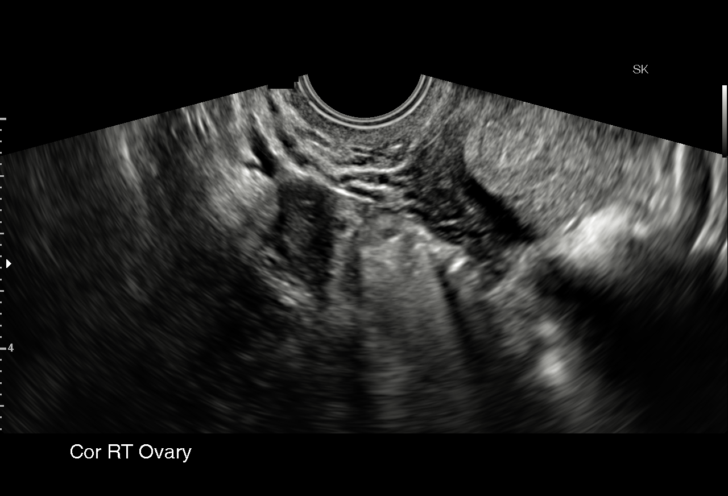
[im 70/77]
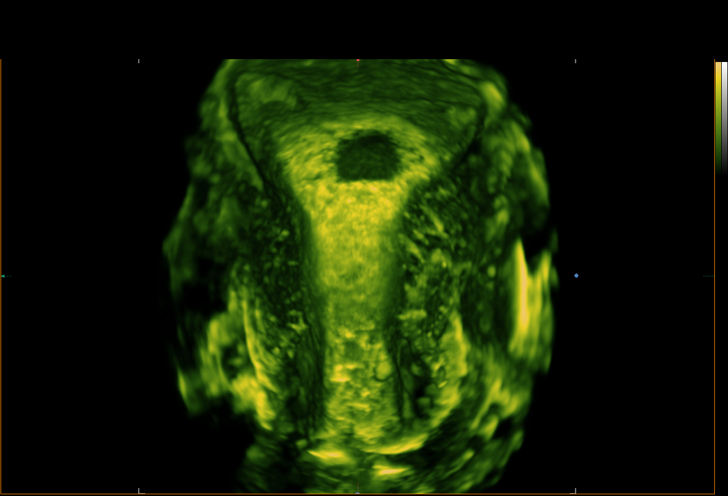
[im 77/77]
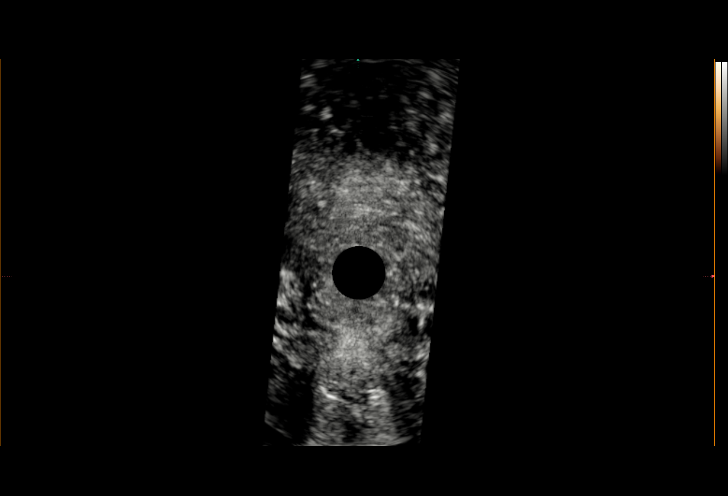

[15 of 25 positions shown; findings below may reference images not displayed]

FINDINGS: Uterus

Measurements: 8.7 x 4.4 x 5.9 cm. No fibroids or other mass
visualized.

Endometrium

The endometrial cavity particularly within the mid body and fundus
is distended with complicated fluid. Within the lower uterine
segment there is thickening of the endometrium with hyperechoic
material measuring up to 1.5 cm.

Right ovary

Measurements: 2.6 x 1.3 x 1.5 cm. Normal appearance/no adnexal mass.

Left ovary

Measurements: 3.1 x 2.4 x 2.4 cm. Normal appearance/no adnexal mass.

Other findings

Small amount of mildly complicated fluid in the pelvis.
IMPRESSION: The endometrial cavity is distended with complicated fluid most
compatible with blood products. Additionally within the endometrium
at the level the lower uterine segment there is distention of the
endometrium with predominantly echogenic material which may
represent an endometrial mass, endometrial thickening or clotted
blood products. This area measures up to 1.5 x 1.2 cm.

Consider further evaluation with sonohysterogram for confirmation
prior to hysteroscopy. Endometrial sampling should also be
considered if patient is at high risk for endometrial carcinoma.
(Ref: Radiological Reasoning: Algorithmic Workup of Abnormal Vaginal
Bleeding with Endovaginal Sonography and Sonohysterography. AJR
[id number]; [id number]:[id number])
